# Patient Record
Sex: Female | Born: 1950 | Race: White | Hispanic: No | Marital: Married | Smoking: Current every day smoker
Health system: Southern US, Community
[De-identification: ages and names within clinical notes are randomized; demographics above are authoritative.]

## PROBLEM LIST (undated history)

## (undated) DIAGNOSIS — F419 Anxiety disorder, unspecified: Secondary | ICD-10-CM

## (undated) DIAGNOSIS — R202 Paresthesia of skin: Secondary | ICD-10-CM

## (undated) DIAGNOSIS — F32A Depression, unspecified: Secondary | ICD-10-CM

## (undated) DIAGNOSIS — F329 Major depressive disorder, single episode, unspecified: Secondary | ICD-10-CM

## (undated) DIAGNOSIS — R002 Palpitations: Secondary | ICD-10-CM

## (undated) DIAGNOSIS — M542 Cervicalgia: Secondary | ICD-10-CM

## (undated) DIAGNOSIS — G4700 Insomnia, unspecified: Secondary | ICD-10-CM

## (undated) DIAGNOSIS — E7800 Pure hypercholesterolemia, unspecified: Secondary | ICD-10-CM

## (undated) DIAGNOSIS — I10 Essential (primary) hypertension: Secondary | ICD-10-CM

## (undated) HISTORY — DX: Essential (primary) hypertension: I10

## (undated) HISTORY — DX: Cervicalgia: M54.2

## (undated) HISTORY — DX: Palpitations: R00.2

## (undated) HISTORY — DX: Anxiety disorder, unspecified: F41.9

## (undated) HISTORY — DX: Pure hypercholesterolemia, unspecified: E78.00

## (undated) HISTORY — DX: Insomnia, unspecified: G47.00

## (undated) HISTORY — DX: Depression, unspecified: F32.A

## (undated) HISTORY — DX: Paresthesia of skin: R20.2

## (undated) HISTORY — DX: Major depressive disorder, single episode, unspecified: F32.9

---

## 1998-07-13 ENCOUNTER — Emergency Department (HOSPITAL_COMMUNITY): Admission: EM | Admit: 1998-07-13 | Discharge: 1998-07-13 | Payer: Self-pay | Admitting: *Deleted

## 1998-07-13 ENCOUNTER — Encounter: Payer: Self-pay | Admitting: *Deleted

## 2012-01-28 ENCOUNTER — Other Ambulatory Visit: Payer: Self-pay | Admitting: Family Medicine

## 2012-01-28 DIAGNOSIS — R911 Solitary pulmonary nodule: Secondary | ICD-10-CM

## 2012-02-05 ENCOUNTER — Other Ambulatory Visit: Payer: Self-pay

## 2012-02-15 ENCOUNTER — Other Ambulatory Visit: Payer: Self-pay

## 2012-02-20 ENCOUNTER — Ambulatory Visit
Admission: RE | Admit: 2012-02-20 | Discharge: 2012-02-20 | Disposition: A | Discharge status: Home / Self Care | Payer: BC Managed Care – PPO | Source: Ambulatory Visit | Attending: Family Medicine | Admitting: Family Medicine

## 2012-02-20 DIAGNOSIS — R911 Solitary pulmonary nodule: Secondary | ICD-10-CM

## 2012-02-20 MED ORDER — IOHEXOL 300 MG/ML  SOLN
75.0000 mL | Freq: Once | INTRAMUSCULAR | Status: AC | PRN
  Administered 2012-02-20: 75 mL via INTRAVENOUS

## 2014-01-04 ENCOUNTER — Ambulatory Visit
Admission: RE | Admit: 2014-01-04 | Discharge: 2014-01-04 | Disposition: A | Discharge status: Home / Self Care | Payer: BC Managed Care – PPO | Source: Ambulatory Visit | Attending: Family Medicine | Admitting: Family Medicine

## 2014-01-04 ENCOUNTER — Other Ambulatory Visit: Payer: Self-pay | Admitting: Family Medicine

## 2014-01-04 DIAGNOSIS — R52 Pain, unspecified: Secondary | ICD-10-CM

## 2014-11-17 ENCOUNTER — Telehealth: Payer: Self-pay | Admitting: Internal Medicine

## 2014-11-17 NOTE — Telephone Encounter (Signed)
[facility] [place] (Dr Fuller MandrilBrent Burnette) for appointment on 12/02/14 with Dr Rennis GoldenHilty.  [facility] for Dr Blanchie DessertHilty's schedule on 12/02/14. lp

## 2014-12-02 ENCOUNTER — Ambulatory Visit: Payer: Self-pay | Admitting: Internal Medicine

## 2015-01-26 IMAGING — US US PELVIS COMPLETE
1 series · 14 of 25 positions shown · non-contrast
Comparison: None

CLINICAL DATA: Bilateral ovary pain. Patient reports menopause at
age 42.

EXAM:
TRANSABDOMINAL AND TRANSVAGINAL ULTRASOUND OF PELVIS
TECHNIQUE: Both transabdominal and transvaginal ultrasound examinations of the
pelvis were performed. Transabdominal technique was performed for
global imaging of the pelvis including uterus, ovaries, adnexal
regions, and pelvic cul-de-sac. It was necessary to proceed with
endovaginal exam following the transabdominal exam to visualize the
endometrium and ovaries.

[Series 1: us pelvis complete · 0.20mm/px · 14 of 50 slices shown]
[im 1/50]
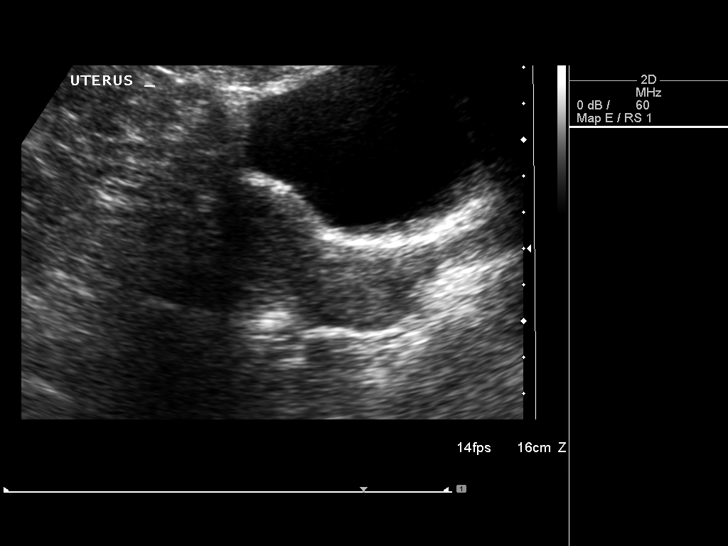
[im 5/50]
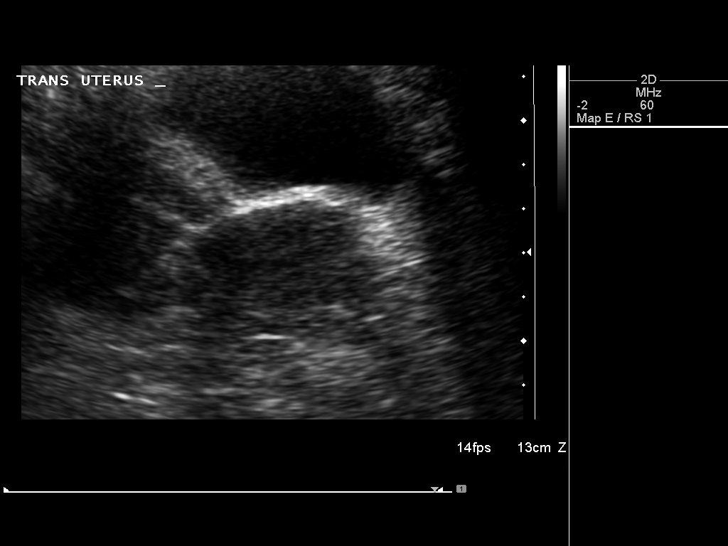
[im 9/50]
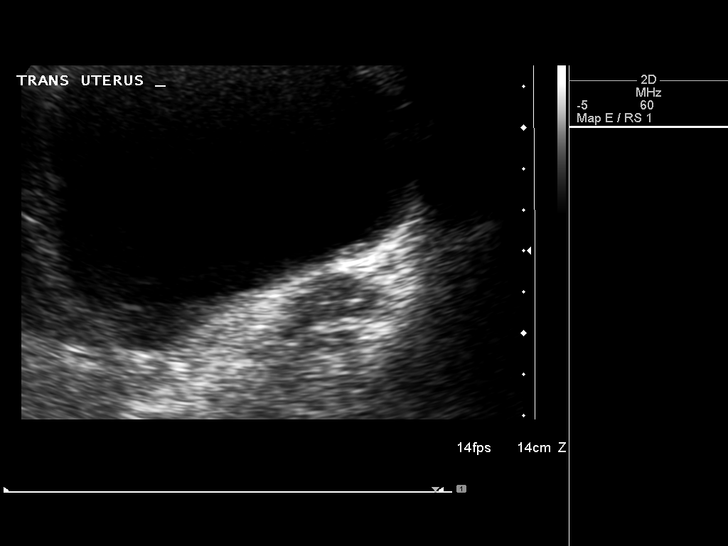
[im 13/50]
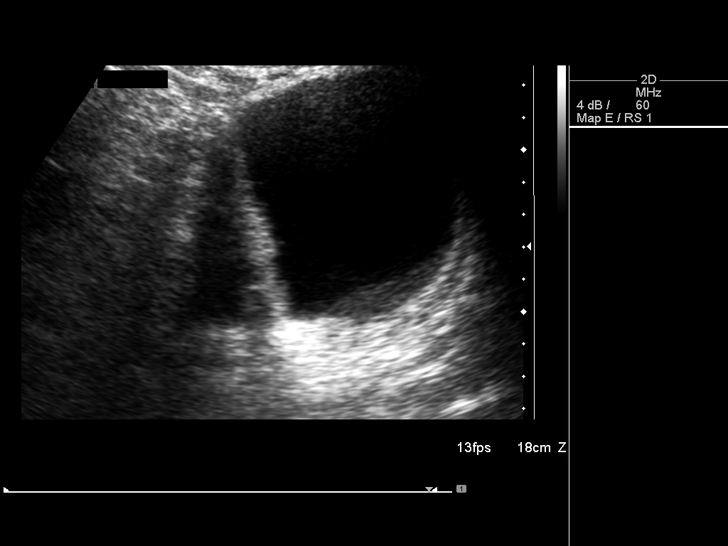
[im 17/50]
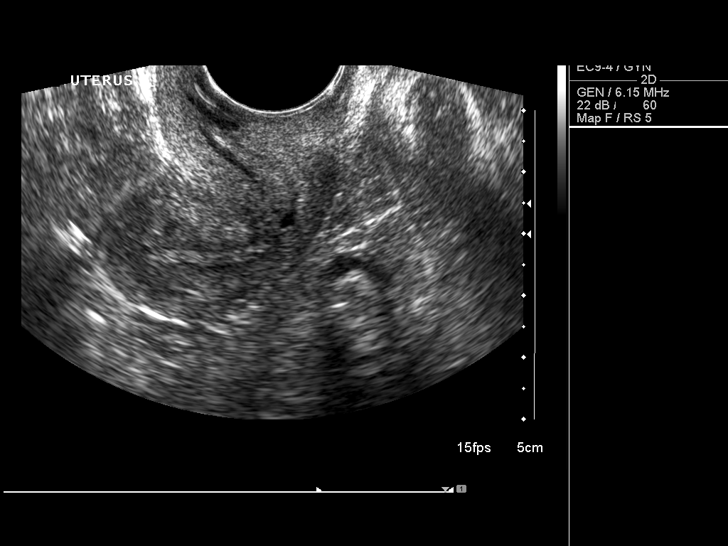
[im 19/50]
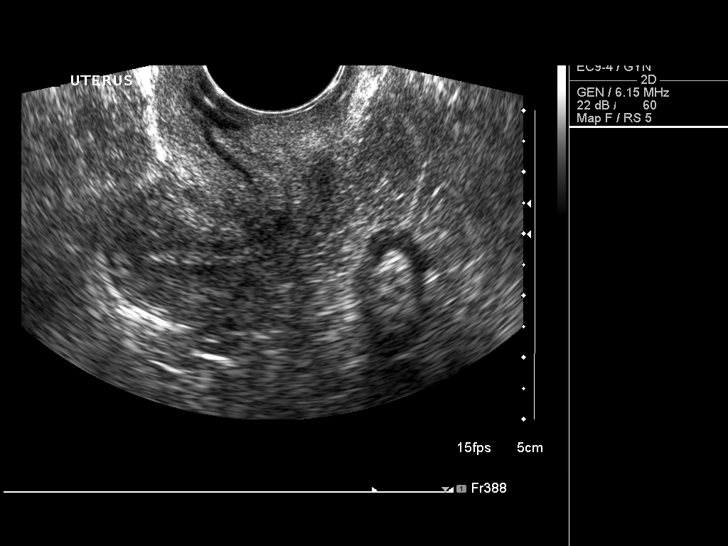
[im 23/50]
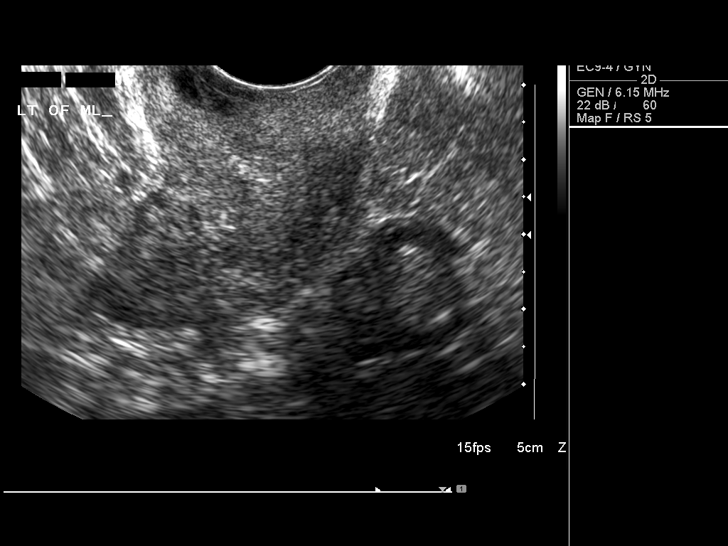
[im 27/50]
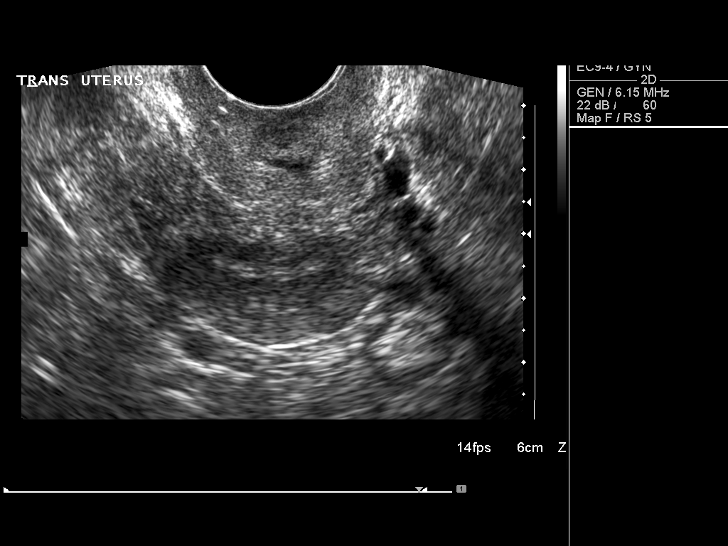
[im 31/50]
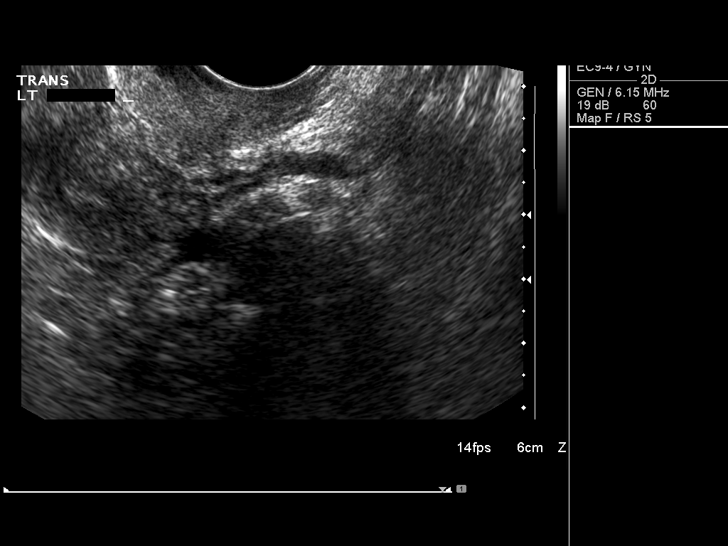
[im 33/50]
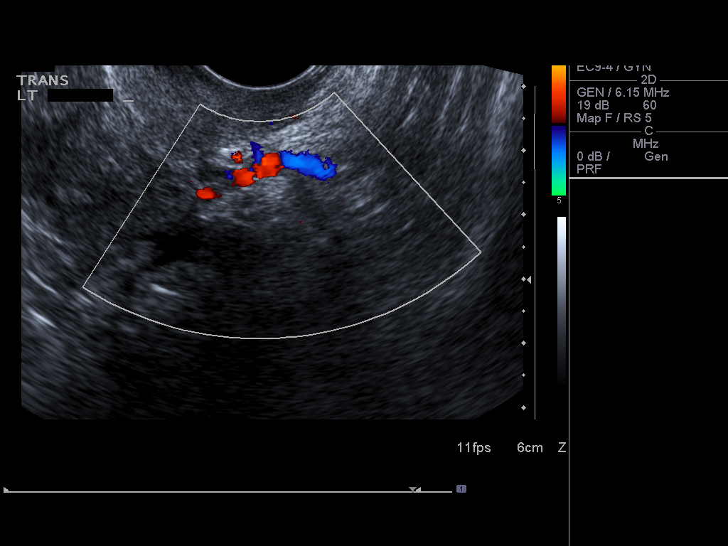
[im 37/50]
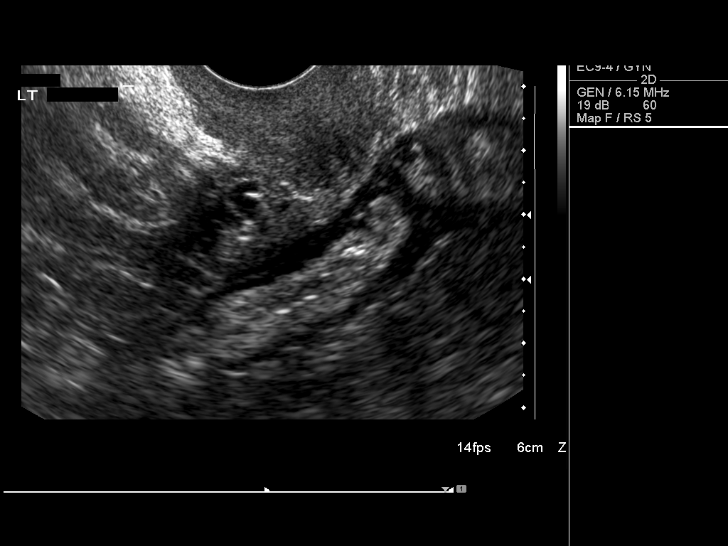
[im 41/50]
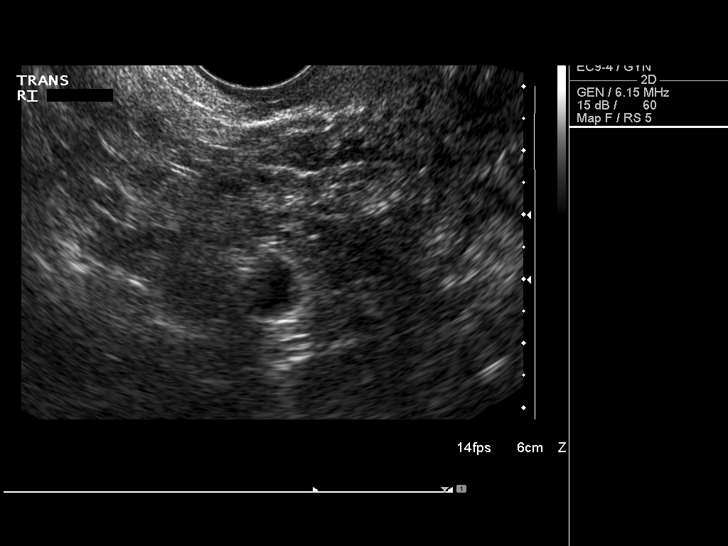
[im 45/50]
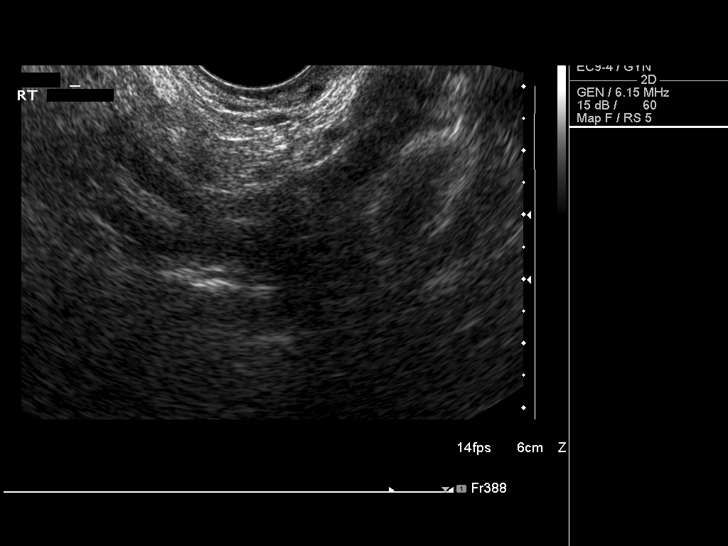
[im 50/50]
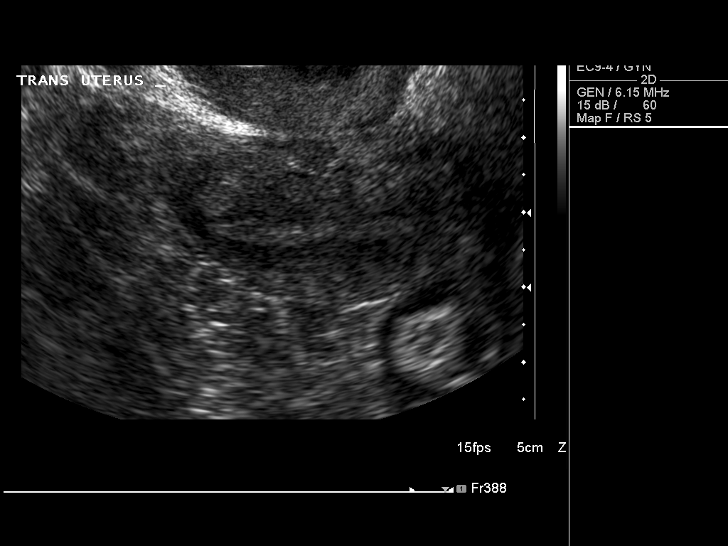

[14 of 25 positions shown; findings below may reference images not displayed]

FINDINGS: Uterus

Measurements: 5.6 x 2.4 x 3.3 cm. No fibroids or other mass
visualized.

Endometrium

Thickness: 2.5 mm.  No focal abnormality visualized.

Right ovary

Measurements: The ovary is not visualized, likely because of
intervening bowel loops..

Left ovary

Measurements: The ovary is not visualized, likely because of
intervening bowel loops..

Other findings

Trace free pelvic fluid identified.
IMPRESSION: 1. Normal appearance of the uterus.
2. No adnexal mass or free pelvic fluid identified.
3. The ovaries are not visualized.

## 2018-02-28 ENCOUNTER — Encounter: Payer: Self-pay | Admitting: *Deleted

## 2018-03-03 ENCOUNTER — Encounter: Payer: Self-pay | Admitting: Diagnostic Neuroimaging

## 2018-03-03 ENCOUNTER — Ambulatory Visit: Payer: 59 | Admitting: Diagnostic Neuroimaging

## 2018-03-03 VITALS — BP 147/81 | HR 79 | Ht 66.0 in | Wt 149.8 lb

## 2018-03-03 DIAGNOSIS — R202 Paresthesia of skin: Secondary | ICD-10-CM

## 2018-03-03 NOTE — Progress Notes (Signed)
[facility]  PATIENT: GISSELE Huerta DOB: [birth year]  REFERRING CLINICIAN: Rueben Bash, PAc HISTORY FROM: patient  REASON FOR VISIT: new consult    HISTORICAL  CHIEF COMPLAINT:  Chief Complaint  Patient presents with  . NP Rueben Bash, PA    Rm 6,   . Tingling in UE (fingers)    constant tingling in fingers for over a year.     HISTORY OF PRESENT ILLNESS:   67 year old female here for evaluation of numbness and tingling.  For past 1 year patient has had intermittent numbness and tingling in her fingers, mainly her thumbs.  Symptoms were intermittent initially but now are constant.  No specific triggering or aggravating factors.  No weakness.  No proximal numbness or tingling.  Sometimes she has a generalized "chilled" sensation throughout her body.  Patient also has significant alcohol abuse drinking 6-7 beers per day, tobacco abuse smoking 1 to 2 packs cigarettes per day, significant insomnia, depression and anxiety.  Patient has some suicidal thoughts but is able to contract for safety today.   REVIEW OF SYSTEMS: Full 14 system review of systems performed and negative with exception of: Fatigue ringing in ears memory loss confusion insomnia dizziness tremor depression anxiety disinterest activities suicidal thoughts decreased energy not asleep feeling cold.  ALLERGIES: Allergies  Allergen Reactions  . Lexapro [Escitalopram Oxalate] Nausea And Vomiting    HOME MEDICATIONS: Outpatient Medications Prior to Visit  Medication Sig Dispense Refill  . ALPRAZolam (XANAX) 0.5 MG tablet Take by mouth 3 (three) times daily as needed.     . Cholecalciferol (VITAMIN D-3) 5000 units TABS Take by mouth daily.    . DULoxetine (CYMBALTA) 30 MG capsule Take 30 mg by mouth daily.    . hydrOXYzine (ATARAX/VISTARIL) 25 MG tablet Take 25 mg by mouth at bedtime as needed.   2   No facility-administered medications prior to visit.     PAST MEDICAL  HISTORY: Past Medical History:  Diagnosis Date  . Anxiety   . Cervicalgia   . Depression   . Insomnia   . Palpitations     PAST SURGICAL HISTORY: History reviewed. No pertinent surgical history.  FAMILY HISTORY: Family History  Problem Relation Age of Onset  . Asthma Father   . Dementia Mother     SOCIAL HISTORY:  Social History   Socioeconomic History  . Marital status: Single    Spouse name: Not on file  . Number of children: Not on file  . Years of education: Not on file  . Highest education level: Not on file  Occupational History  . Not on file  Social Needs  . Financial resource strain: Not on file  . Food insecurity:    Worry: Not on file    Inability: Not on file  . Transportation needs:    Medical: Not on file    Non-medical: Not on file  Tobacco Use  . Smoking status: Current Every Day Smoker    Packs/day: 1.50    Types: Cigarettes  . Smokeless tobacco: Never Used  Substance and Sexual Activity  . Alcohol use: Yes    Comment: 6-7 beers   . Drug use: Never  . Sexual activity: Not on file  Lifestyle  . Physical activity:    Days per week: Not on file    Minutes per session: Not on file  . Stress: Not on file  Relationships  . Social connections:    Talks on phone: Not on file  Gets together: Not on file    Attends religious service: Not on file    Active member of club or organization: Not on file    Attends meetings of clubs or organizations: Not on file    Relationship status: Not on file  . Intimate partner violence:    Fear of current or ex partner: Not on file    Emotionally abused: Not on file    Physically abused: Not on file    Forced sexual activity: Not on file  Other Topics Concern  . Not on file  Social History Narrative   Pt lives with husband,  Education HS.  Retired.  Children none.       PHYSICAL EXAM   GENERAL EXAM/CONSTITUTIONAL: Vitals:  Vitals:   03/03/18 1409  BP: (!) 147/81  Pulse: 79  Weight: 149 lb  12.8 oz (67.9 kg)  Height:  (1.676 m)     Body mass index is 24.18 kg/m.  Visual Acuity Screening   Right eye Left eye Both eyes  Without correction:     With correction: 20/100 20/30      Patient is in no distress; well developed, nourished and groomed; neck is supple  CARDIOVASCULAR:  Examination of carotid arteries is normal; no carotid bruits  Regular rate and rhythm, no murmurs  Examination of peripheral vascular system by observation and palpation is normal  EYES:  Ophthalmoscopic exam of optic discs and posterior segments is normal; no papilledema or hemorrhages  MUSCULOSKELETAL:  Gait, strength, tone, movements noted in Neurologic exam below  NEUROLOGIC: MENTAL STATUS:  No flowsheet data found.  awake, alert, oriented to person, place and time  recent and remote memory intact  normal attention and concentration  language fluent, comprehension intact, naming intact,   fund of knowledge appropriate  CRANIAL NERVE:   2nd - no papilledema on fundoscopic exam  2nd, 3rd, 4th, 6th - pupils equal and reactive to light, visual fields full to confrontation, extraocular muscles intact, no nystagmus  5th - facial sensation symmetric  7th - facial strength symmetric  8th - hearing intact  9th - palate elevates symmetrically, uvula midline  11th - shoulder shrug symmetric  12th - tongue protrusion midline  MOTOR:   normal bulk and tone, full strength in the BUE, BLE; EXCEPT ATROPHY AND WEAKNESS OF LEFT APB  SENSORY:   normal and symmetric to light touch, pinprick, temperature, vibration; EXCEPT DECR PP IN LEFT HAND (DIGIT 2)  COORDINATION:   finger-nose-finger, fine finger movements normal  REFLEXES:   deep tendon reflexes present and symmetric  GAIT/STATION:   narrow based gait; TANDEM UNSTEADY    DIAGNOSTIC DATA (LABS, IMAGING, TESTING) - I reviewed patient records, labs, notes, testing and imaging myself where available.  No  results found for: WBC, HGB, HCT, MCV, PLT No results found for: NA, K, CL, CO2, GLUCOSE, BUN, CREATININE, CALCIUM, PROT, ALBUMIN, AST, ALT, ALKPHOS, BILITOT, GFRNONAA, GFRAA No results found for: CHOL, HDL, LDLCALC, LDLDIRECT, TRIG, CHOLHDL No results found for: ZOXW9U No results found for: VITAMINB12 No results found for: TSH      ASSESSMENT AND PLAN  67 y.o. year old female here with at least 1 year of numbness and tingling in fingertips bilaterally, likely peripheral neuropathy.  We will proceed with further work-up.  Also with significant alcohol abuse, insomnia, depression, anxiety.  Patient is planning to establish with psychiatry.   Ddx: carpal tunnel syndrome, cervical radiculopathy, other secondary causes  1. Paresthesia of both hands  PLAN:  - check EMG/NCS  Orders Placed This Encounter  Procedures  . NCV with EMG(electromyography)   Return for for NCV/EMG.    Suanne Marker, MD 03/03/2018, 2:31 PM Certified in Neurology, Neurophysiology and Neuroimaging  [facility] [address], [address] [place], [place] [address] [phone]

## 2018-03-03 NOTE — Patient Instructions (Signed)
-   return for emg/ncs (electrical nerve testing)

## 2018-04-10 ENCOUNTER — Encounter: Payer: 59 | Admitting: Diagnostic Neuroimaging

## 2019-06-11 ENCOUNTER — Other Ambulatory Visit: Payer: Self-pay

## 2019-06-11 ENCOUNTER — Ambulatory Visit (INDEPENDENT_AMBULATORY_CARE_PROVIDER_SITE_OTHER): Payer: Medicare HMO | Admitting: Diagnostic Neuroimaging

## 2019-06-11 ENCOUNTER — Encounter: Payer: Medicare HMO | Admitting: Diagnostic Neuroimaging

## 2019-06-11 DIAGNOSIS — R200 Anesthesia of skin: Secondary | ICD-10-CM | POA: Diagnosis not present

## 2019-06-11 DIAGNOSIS — Z0289 Encounter for other administrative examinations: Secondary | ICD-10-CM

## 2019-06-15 NOTE — Procedures (Signed)
[facility]  NCS (NERVE CONDUCTION STUDY) WITH EMG (ELECTROMYOGRAPHY) REPORT   STUDY DATE: 06/11/19 PATIENT NAME: Jamie Huerta DOB: 02-12-1951 MRN: [id number]  ORDERING CLINICIAN: Andrey Spearman, MD   TECHNOLOGIST: Sherre Scarlet ELECTROMYOGRAPHER: Earlean Polka. , MD  CLINICAL INFORMATION: 68 year old female here for evaluation of numbness and pain.  FINDINGS: NERVE CONDUCTION STUDY: Bilateral median and ulnar motor responses are normal.  Bilateral ulnar F-wave latencies are normal.  Bilateral median and ulnar sensory responses are normal.   NEEDLE ELECTROMYOGRAPHY:  Needle examination of left upper extremity deltoid, biceps, triceps, flexor carpi radialis, first dorsal interosseous is normal.   IMPRESSION:   Normal study.  No electrodiagnostic evidence of large fiber neuropathy at this time.   INTERPRETING PHYSICIAN:  Penni Bombard, MD Certified in Neurology, Neurophysiology and Neuroimaging  [facility] [address], [place],  [address] [phone]   [facility]    Nerve / Sites Muscle Latency Ref. Amplitude Ref. Rel Amp Segments Distance Velocity Ref. Area    ms ms mV mV %  cm m/s m/s mVms  R Median - APB     Wrist APB 3.3 ?4.4 7.0 ?4.0 100 Wrist - APB 7   33.2     Upper arm APB 7.1  6.9  97.6 Upper arm - Wrist 21 55 ?49 32.4  L Median - APB     Wrist APB 3.2 ?4.4 8.0 ?4.0 100 Wrist - APB 7   31.8     Upper arm APB 7.1  7.4  91.8 Upper arm - Wrist 21 53 ?49 28.6  R Ulnar - ADM     Wrist ADM 2.6 ?3.3 9.1 ?6.0 100 Wrist - ADM 7   33.9     B.Elbow ADM 6.1  8.5  93 B.Elbow - Wrist 19 53 ?49 31.5     A.Elbow ADM 8.2  8.3  98.1 A.Elbow - B.Elbow 10 49 ?49 30.3         A.Elbow - Wrist      L Ulnar - ADM     Wrist ADM 2.5 ?3.3 11.0 ?6.0 100 Wrist - ADM 7   39.5     B.Elbow ADM 6.2  10.7  96.9 B.Elbow - Wrist 19 51 ?49 38.9     A.Elbow ADM 8.2  10.6  99.3 A.Elbow - B.Elbow 10 49 ?49 38.0         A.Elbow -  Wrist                 SNC    Nerve / Sites Rec. Site Peak Lat Ref.  Amp Ref. Segments Distance    ms ms V V  cm  R Median - Orthodromic (Dig II, Mid palm)     Dig II Wrist 2.9 ?3.4 18 ?10 Dig II - Wrist 13  L Median - Orthodromic (Dig II, Mid palm)     Dig II Wrist 3.0 ?3.4 19 ?10 Dig II - Wrist 13  R Ulnar - Orthodromic, (Dig V, Mid palm)     Dig V Wrist 2.9 ?3.1 5 ?5 Dig V - Wrist 11  L Ulnar - Orthodromic, (Dig V, Mid palm)     Dig V Wrist 2.8 ?3.1 6 ?5 Dig V - Wrist 44              F  Wave    Nerve F Lat Ref.   ms ms  R Ulnar - ADM 29.3 ?32.0  L Ulnar - ADM 29.3 ?  32.0         EMG full       EMG Summary Table    Spontaneous MUAP Recruitment  Muscle IA Fib PSW Fasc Other Amp Dur. Poly Pattern  L. Deltoid Normal None None None _______ Normal Normal Normal Normal  L. Biceps brachii Normal None None None _______ Normal Normal Normal Normal  L. Triceps brachii Normal None None None _______ Normal Normal Normal Normal  L. Flexor carpi radialis Normal None None None _______ Normal Normal Normal Normal  L. First dorsal interosseous Normal None None None _______ Normal Normal Normal Normal

## 2021-09-27 ENCOUNTER — Encounter: Payer: Self-pay | Admitting: *Deleted

## 2021-10-03 ENCOUNTER — Institutional Professional Consult (permissible substitution): Payer: Medicare HMO | Admitting: Diagnostic Neuroimaging

## 2022-09-26 ENCOUNTER — Other Ambulatory Visit: Payer: Self-pay | Admitting: *Deleted

## 2022-09-26 DIAGNOSIS — R209 Unspecified disturbances of skin sensation: Secondary | ICD-10-CM

## 2022-10-05 ENCOUNTER — Encounter: Payer: Self-pay | Admitting: Vascular Surgery

## 2022-10-05 ENCOUNTER — Ambulatory Visit (HOSPITAL_COMMUNITY)
Admission: RE | Admit: 2022-10-05 | Discharge: 2022-10-05 | Disposition: A | Discharge status: Home / Self Care | Payer: Medicare HMO | Source: Ambulatory Visit | Attending: Vascular Surgery | Admitting: Vascular Surgery

## 2022-10-05 ENCOUNTER — Ambulatory Visit: Payer: Medicare HMO | Admitting: Vascular Surgery

## 2022-10-05 VITALS — BP 149/89 | HR 85 | Temp 98.7°F | Resp 20 | Ht 66.0 in | Wt 144.0 lb

## 2022-10-05 DIAGNOSIS — R202 Paresthesia of skin: Secondary | ICD-10-CM

## 2022-10-05 DIAGNOSIS — R278 Other lack of coordination: Secondary | ICD-10-CM | POA: Diagnosis not present

## 2022-10-05 DIAGNOSIS — R208 Other disturbances of skin sensation: Secondary | ICD-10-CM

## 2022-10-05 DIAGNOSIS — R209 Unspecified disturbances of skin sensation: Secondary | ICD-10-CM | POA: Diagnosis present
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
## 2022-10-29 ENCOUNTER — Other Ambulatory Visit (HOSPITAL_COMMUNITY): Payer: Medicare HMO

## 2022-11-02 ENCOUNTER — Ambulatory Visit: Payer: Medicare HMO | Admitting: Vascular Surgery
